# Patient Record
Sex: Female | Born: 1995 | Race: Black or African American | Hispanic: No | Marital: Married | Smoking: Never smoker
Health system: Southern US, Community
[De-identification: ages and names within clinical notes are randomized; demographics above are authoritative.]

## PROBLEM LIST (undated history)

## (undated) DIAGNOSIS — T7840XA Allergy, unspecified, initial encounter: Secondary | ICD-10-CM

## (undated) HISTORY — PX: OTHER SURGICAL HISTORY: SHX169

## (undated) HISTORY — DX: Allergy, unspecified, initial encounter: T78.40XA

---
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
## 2018-09-19 ENCOUNTER — Other Ambulatory Visit: Payer: Self-pay | Admitting: Radiology

## 2018-09-19 DIAGNOSIS — N6320 Unspecified lump in the left breast, unspecified quadrant: Secondary | ICD-10-CM

## 2018-10-01 ENCOUNTER — Other Ambulatory Visit: Payer: 59

## 2020-06-30 ENCOUNTER — Other Ambulatory Visit: Payer: Self-pay | Admitting: Radiology

## 2020-06-30 DIAGNOSIS — N63 Unspecified lump in unspecified breast: Secondary | ICD-10-CM

## 2020-07-14 ENCOUNTER — Other Ambulatory Visit: Payer: Self-pay

## 2020-07-14 ENCOUNTER — Ambulatory Visit
Admission: RE | Admit: 2020-07-14 | Discharge: 2020-07-14 | Disposition: A | Discharge status: Home / Self Care | Payer: No Typology Code available for payment source | Source: Ambulatory Visit | Attending: Radiology | Admitting: Radiology

## 2020-07-14 DIAGNOSIS — N63 Unspecified lump in unspecified breast: Secondary | ICD-10-CM
